# Patient Record
Sex: Female | Born: 1970
Health system: Southern US, Community
[De-identification: ages and names within clinical notes are randomized; demographics above are authoritative.]

## PROBLEM LIST (undated history)

## (undated) DIAGNOSIS — K219 Gastro-esophageal reflux disease without esophagitis: Secondary | ICD-10-CM

## (undated) DIAGNOSIS — T4145XA Adverse effect of unspecified anesthetic, initial encounter: Secondary | ICD-10-CM

## (undated) DIAGNOSIS — T8859XA Other complications of anesthesia, initial encounter: Secondary | ICD-10-CM

## (undated) HISTORY — PX: ECTOPIC PREGNANCY SURGERY: SHX613

## (undated) HISTORY — PX: TUBAL LIGATION: SHX77

## (undated) HISTORY — PX: FOOT SURGERY: SHX648

## (undated) HISTORY — PX: ABDOMINAL HYSTERECTOMY: SHX81

---
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
## 2019-04-17 ENCOUNTER — Emergency Department (HOSPITAL_BASED_OUTPATIENT_CLINIC_OR_DEPARTMENT_OTHER): Payer: Worker's Compensation

## 2019-04-17 ENCOUNTER — Encounter (HOSPITAL_BASED_OUTPATIENT_CLINIC_OR_DEPARTMENT_OTHER): Payer: Self-pay

## 2019-04-17 ENCOUNTER — Emergency Department (HOSPITAL_BASED_OUTPATIENT_CLINIC_OR_DEPARTMENT_OTHER)
Admission: EM | Admit: 2019-04-17 | Discharge: 2019-04-17 | Disposition: A | Discharge status: Home / Self Care | Payer: Worker's Compensation | Attending: Emergency Medicine | Admitting: Emergency Medicine

## 2019-04-17 ENCOUNTER — Other Ambulatory Visit: Payer: Self-pay

## 2019-04-17 DIAGNOSIS — F17290 Nicotine dependence, other tobacco product, uncomplicated: Secondary | ICD-10-CM | POA: Insufficient documentation

## 2019-04-17 DIAGNOSIS — S66397A Other injury of extensor muscle, fascia and tendon of left little finger at wrist and hand level, initial encounter: Secondary | ICD-10-CM | POA: Insufficient documentation

## 2019-04-17 DIAGNOSIS — W312XXA Contact with powered woodworking and forming machines, initial encounter: Secondary | ICD-10-CM | POA: Diagnosis not present

## 2019-04-17 DIAGNOSIS — Y9389 Activity, other specified: Secondary | ICD-10-CM | POA: Diagnosis not present

## 2019-04-17 DIAGNOSIS — S62617B Displaced fracture of proximal phalanx of left little finger, initial encounter for open fracture: Secondary | ICD-10-CM | POA: Insufficient documentation

## 2019-04-17 DIAGNOSIS — Y999 Unspecified external cause status: Secondary | ICD-10-CM | POA: Insufficient documentation

## 2019-04-17 DIAGNOSIS — S66902A Unspecified injury of unspecified muscle, fascia and tendon at wrist and hand level, left hand, initial encounter: Secondary | ICD-10-CM

## 2019-04-17 DIAGNOSIS — Y9289 Other specified places as the place of occurrence of the external cause: Secondary | ICD-10-CM | POA: Insufficient documentation

## 2019-04-17 DIAGNOSIS — S61217A Laceration without foreign body of left little finger without damage to nail, initial encounter: Secondary | ICD-10-CM | POA: Diagnosis present

## 2019-04-17 DIAGNOSIS — S62609B Fracture of unspecified phalanx of unspecified finger, initial encounter for open fracture: Secondary | ICD-10-CM

## 2019-04-17 MED ORDER — SULFAMETHOXAZOLE-TRIMETHOPRIM 800-160 MG PO TABS
1.0000 | ORAL_TABLET | Freq: Once | ORAL | Status: AC
  Administered 2019-04-17: 16:00:00 1 via ORAL
  Filled 2019-04-17: qty 1

## 2019-04-17 MED ORDER — OXYCODONE-ACETAMINOPHEN 5-325 MG PO TABS
1.0000 | ORAL_TABLET | Freq: Once | ORAL | Status: AC
  Administered 2019-04-17: 13:00:00 1 via ORAL
  Filled 2019-04-17: qty 1

## 2019-04-17 MED ORDER — LIDOCAINE HCL (PF) 1 % IJ SOLN
5.0000 mL | Freq: Once | INTRAMUSCULAR | Status: AC
  Administered 2019-04-17: 5 mL
  Filled 2019-04-17: qty 5

## 2019-04-17 MED ORDER — HYDROCODONE-ACETAMINOPHEN 5-325 MG PO TABS: 1.0000 | ORAL_TABLET | Freq: Four times a day (QID) | ORAL | 0 refills | Status: AC | PRN

## 2019-04-17 MED ORDER — SULFAMETHOXAZOLE-TRIMETHOPRIM 800-160 MG PO TABS: 1.0000 | ORAL_TABLET | Freq: Two times a day (BID) | ORAL | 0 refills | Status: AC

## 2019-04-17 MED ORDER — BUPIVACAINE HCL 0.25 % IJ SOLN
5.0000 mL | Freq: Once | INTRAMUSCULAR | Status: AC
  Administered 2019-04-17: 5 mL
  Filled 2019-04-17: qty 1

## 2019-04-17 MED ORDER — TETANUS-DIPHTH-ACELL PERTUSSIS 5-2.5-18.5 LF-MCG/0.5 IM SUSP
0.5000 mL | Freq: Once | INTRAMUSCULAR | Status: AC
  Administered 2019-04-17: 0.5 mL via INTRAMUSCULAR
  Filled 2019-04-17: qty 0.5

## 2019-04-17 MED FILL — SULFAMETHOXAZOLE-TMP DS TAB: 800-160 | 14 days supply | Qty: 27 | Fill #0

## 2019-04-17 MED FILL — HYDROCODON-APAP 5-325: 5-325 | 2 days supply | Qty: 12 | Fill #0

## 2019-04-17 NOTE — ED Notes (Signed)
Patient transported to X-ray 

## 2019-04-17 NOTE — ED Notes (Signed)
ED Provider at bedside. 

## 2019-04-17 NOTE — ED Notes (Addendum)
Pt denies taking medications pta. Pt sent to ED from [facility], states she was informed that she needed stitches

## 2019-04-17 NOTE — ED Triage Notes (Signed)
Pt with left pinky finger vs "ban saw" at work ~11am-NAD-steady gait

## 2019-04-17 NOTE — ED Notes (Signed)
Suture cart and suture tray at roomside

## 2019-04-17 NOTE — ED Provider Notes (Signed)
[facility] DEPART[facility] Provider Note   CSN: [id number] Arrival date & time: 04/17/19  1214    History   Chief Complaint Chief Complaint  Patient presents with   Finger Injury    HPI Gail Khan is a 48 y.o. female.     HPI   Patient is a 48 year old female with no significant past medical history presenting for left small finger injury.  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  Patient reports that she saw that her glove was cut but did not realize the extent of the injury until she removed her glove.  Patient reports that her finger quickly contractured in flexion after the injury.  Patient reports it is throbbing but denies any loss of sensation in the distal tip.  Unknown last tetanus shot.  History reviewed. No pertinent past medical history.  There are no active problems to display for this patient.   Past Surgical History:  Procedure Laterality Date   ABDOMINAL HYSTERECTOMY     CESAREAN SECTION     ECTOPIC PREGNANCY SURGERY     FOOT SURGERY     TUBAL LIGATION       OB History   No obstetric history on file.      Home Medications    Prior to Admission medications   Not on File    Family History No family history on file.  Social History Social History   Tobacco Use   Smoking status: Current Every Day Smoker   Smokeless tobacco: Never Used  Substance Use Topics   Alcohol use: Never    Frequency: Never   Drug use: Never     Allergies   Patient has no known allergies.   Review of Systems Review of Systems  Gastrointestinal: Negative for nausea and vomiting.  Musculoskeletal: Positive for arthralgias and joint swelling.  Skin: Positive for wound.  Neurological: Negative for weakness and numbness.  All other systems reviewed and are negative.    Physical Exam Updated Vital Signs BP 118/76 (BP  Location: Left Arm)    Pulse 93    Temp 98.7 F (37.1 C) (Oral)    Resp 16    Ht 5\' 7"  (1.702 m)    Wt 75.3 kg    SpO2 100%    BMI 26.00 kg/m   Physical Exam Vitals signs and nursing note reviewed.  Constitutional:      General: She is not in acute distress.    Appearance: She is well-developed. She is not diaphoretic.     Comments: Sitting comfortably in bed.  HENT:     Head: Normocephalic and atraumatic.  Eyes:     General:        Right eye: No discharge.        Left eye: No discharge.     Conjunctiva/sclera: Conjunctivae normal.     Comments: EOMs normal to gross examination.  Neck:     Musculoskeletal: Normal range of motion.  Cardiovascular:     Rate and Rhythm: Normal rate and regular rhythm.     Comments: Intact, 2+ left radial and ulnar pulse. Abdominal:     General: There is no distension.  Musculoskeletal:     Comments: See clinical photo for details.  Patient has open deformity overlying the PIP joint of left small finger.  Patient holds finger in static flexion.  Patient  has intact sensation to sharp touch and two-point discrimination in the distal tip of the left small finger.  She is not able to flex or extend the finger currently.  Skin:    General: Skin is warm and dry.  Neurological:     Mental Status: She is alert.     Comments: Cranial nerves intact to gross observation. Patient moves extremities without difficulty.  Psychiatric:        Behavior: Behavior normal.        Thought Content: Thought content normal.        Judgment: Judgment normal.              ED Treatments / Results  Labs (all labs ordered are listed, but only abnormal results are displayed) Labs Reviewed - No data to display  EKG None  Radiology Dg Finger Little Left  Result Date: 04/17/2019 CLINICAL DATA:  Post reduction of the small finger PIP joint. EXAM: LEFT LITTLE FINGER 2+V COMPARISON:  Left small finger x-rays from same day. FINDINGS: Interval reduction of the fifth  PIP joint with residual volar subluxation by 3 mm. Unchanged acute fracture of the dorsal aspect of the distal fifth proximal phalanx with surrounding bone fragments in the soft tissues and adjacent soft tissue laceration. There is also an avulsion fracture of the dorsal base of the fifth middle phalanx. Joint spaces are preserved. Bone mineralization is normal. IMPRESSION: 1. Interval reduction of the fifth PIP joint with residual volar subluxation. 2. Unchanged open fracture of the distal fifth proximal phalanx and small avulsion fracture at the dorsal base of the fifth middle phalanx. Electronically Signed   By: Obie DredgeWilliam T Derry M.D.   On: 04/17/2019 16:24   Dg Finger Little Left  Result Date: 04/17/2019 CLINICAL DATA:  Saw injury EXAM: LEFT LITTLE FINGER 2+V COMPARISON:  None. FINDINGS: Fracture of the distal aspect of the proximal fifth phalanx extending into the PIP joint. PIP joint is subluxed anteriorly. Possible fracture of the base of the middle phalanx. Bone fragments in the soft tissues. Open fracture with laceration. IMPRESSION: Open fracture fifth PIP joint Electronically Signed   By: Marlan Palauharles  Clark M.D.   On: 04/17/2019 13:30    Procedures .Marland Kitchen.Laceration Repair Date/Time: 04/17/2019 5:30 PM Performed by: Elisha PonderMurray, Citlalic Norlander B, PA-C Authorized by: Elisha PonderMurray, Londa Mackowski B, PA-C   Consent:    Consent obtained:  Verbal   Consent given by:  Patient   Risks discussed:  Infection, pain and need for additional repair Anesthesia (see MAR for exact dosages):    Anesthesia method:  Nerve block   Block location:  Digital block   Block needle gauge:  25 G   Block anesthetic:  Bupivacaine 0.25% w/o epi and lidocaine 1% w/o epi   Block injection procedure:  Anatomic landmarks identified, introduced needle, incremental injection, negative aspiration for blood and anatomic landmarks palpated   Block outcome:  Anesthesia achieved Laceration details:    Location:  Finger   Finger location:  L small finger    Length (cm):  2 Repair type:    Repair type:  Simple Pre-procedure details:    Preparation:  Patient was prepped and draped in usual sterile fashion and imaging obtained to evaluate for foreign bodies Exploration:    Hemostasis achieved with:  Direct pressure   Wound exploration: wound explored through full range of motion     Wound extent: tendon damage and underlying fracture     Wound extent: no nerve damage noted     Tendon  damage extent:  Complete transection   Tendon repair plan:  Refer for evaluation   Contaminated: no   Treatment:    Area cleansed with:  Betadine   Amount of cleaning:  Extensive   Irrigation solution:  Sterile saline   Irrigation volume:  1000 ml   Irrigation method:  Syringe Skin repair:    Repair method:  Sutures   Suture size:  4-0   Wound skin closure material used: Vicryl rapide.   Suture technique:  Simple interrupted   Number of sutures:  2 Approximation:    Approximation:  Loose Post-procedure details:    Dressing:  Bulky dressing and splint for protection (Splinted in extension)   Patient tolerance of procedure:  Tolerated well, no immediate complications   (including critical care time)  Medications Ordered in ED Medications - No data to display     Initial Impression / Assessment and Plan / ED Course  I have reviewed the triage vital signs and the nursing notes.  Pertinent labs & imaging results that were available during my care of the patient were reviewed by me and considered in my medical decision making (see chart for details).  Clinical Course as of Apr 16 1725  Tue Apr 17, 2019  1406 Radiograph showing fracture dislocation of the left small finger.  Will digital block finger.  Case discussed and patient imaging reviewed with Dr. Gwyneth SproutWhitney Plunkett.   [AM]  [phone] Spoke with Dr. Amanda PeaGramig who recommends post reduction film, tacking together with suture, Bactrim DS x 14 days, and he will see the patient tomorrow at 3pm. Appreciate his  involvement.    [AM]    Clinical Course User Index [AM] Elisha PonderMurray, Debraann Livingstone B, PA-C       This is a well-appearing previously healthy 48 year old female presenting for bone saw injury to the left small finger.  Obvious deformity on evaluation.  Patient has finger in static flexion and loss of extensor mechanism.  Radiograph confirmed fracture-dislocation of the distal aspect of the proximal fifth phalanx.  Distal aspect subluxed anteriorly.  This was reduced and reimaged at the request of Dr. Amanda PeaGramig.  Patient has significant laxity of the joint.  Per discussion with Dr. Amanda PeaGramig, the skin was tacked together with 2 sutures, patient placed in a bacteriostatic dressing and splinted in extension.  Patient given Bactrim DS prophylaxis.   I have reviewed the patient's information in the [place] Sub[place] Database for the past 12 months and found them to have no Rx on record. Opiates were prescribed for an acute, painful condition. The patient was given information on side effects and encouraged to use other, non-opiate pain medication primary, only using opiate medicine sparingly for severe pain.  This is a supervised visit with Dr. Gwyneth SproutWhitney Plunkett. Evaluation, management, and discharge planning discussed with this attending physician.  Final Clinical Impressions(s) / ED Diagnoses   Final diagnoses:  Open fracture dislocation of finger, initial encounter  Injury of extensor tendon of left hand, initial encounter    ED Discharge Orders         Ordered    HYDROcodone-acetaminophen (NORCO/VICODIN) 5-325 MG tablet  Every 6 hours PRN     04/17/19 1723    sulfamethoxazole-trimethoprim (BACTRIM DS) 800-160 MG tablet  2 times daily     04/17/19 1723           Delia ChimesMurray, Katriel Cutsforth B, PA-C 04/17/19 1732    Gwyneth SproutPlunkett, Whitney, MD 04/20/19 2117

## 2019-04-17 NOTE — Discharge Instructions (Signed)
Please see the information and instructions below regarding your visit.  Your diagnoses today include:  1. Open fracture dislocation of finger, initial encounter   2. Injury of extensor tendon of left hand, initial encounter     Tests performed today include: See side panel of your discharge paperwork for testing performed today. Vital signs are listed at the bottom of these instructions.   Medications prescribed:    Take any prescribed medications only as prescribed, and any over the counter medications only as directed on the packaging.  Please take all of your antibiotics until finished.   You will take bactrim twice a day for 14 days. You may develop abdominal discomfort or nausea from the antibiotic. If this occurs, you may take it with food. Some patients also get diarrhea with antibiotics. You may help offset this with probiotics which you can buy or get in yogurt. Do not eat or take the probiotics until 2 hours after your antibiotic. Some women develop vaginal yeast infections after antibiotics. If you develop unusual vaginal discharge after being on this medication, please see your primary care provider.   Some people develop allergies to antibiotics. Symptoms of antibiotic allergy can be mild and include a flat rash and itching. They can also be more serious and include:  ?Hives - Hives are raised, red patches of skin that are usually very itchy.  ?Lip or tongue swelling  ?Trouble swallowing or breathing  ?Blistering of the skin or mouth.  If you have any of these serious symptoms, please seek emergency medical care immediately.   You have been prescribed Norco for pain. This is an opioid pain medication. You may take this medication every 4-6 hours as needed for pain. Only take this medication if you need it for breakthrough pain.   Do not combine this medication with Tylenol, as it may increase the risk of liver problems.  Do not combine this medication with  alcohol.  Please be advised to avoid driving or operating heavy machinery while taking this medication, as it may make you drowsy or impair judgment.    Home care instructions:  Please follow any educational materials contained in this packet.   Follow-up instructions: Please follow-up with Dr. Amedeo Plenty on 04/18/2019 at 3pm in his [place] clinic.  Return instructions:  Please return to the Emergency Department if you experience worsening symptoms.  Please return if you have any other emergent concerns.  Additional Information:   Your vital signs today were: BP 125/81 (BP Location: Right Arm)    Pulse 73    Temp 98.7 F (37.1 C) (Oral)    Resp 18    Ht 5\' 7"  (1.702 m)    Wt 75.3 kg    SpO2 100%    BMI 26.00 kg/m  If your blood pressure (BP) was elevated on multiple readings during this visit above 130 for the top number or above 80 for the bottom number, please have this repeated by your primary care provider within one month. --------------  Thank you for allowing [place] to participate in your care today.

## 2019-04-17 NOTE — ED Notes (Signed)
Pt c/o numbness and tingling to pinky when being assessed.

## 2019-04-19 ENCOUNTER — Encounter (HOSPITAL_COMMUNITY): Payer: Self-pay | Admitting: *Deleted

## 2019-04-19 ENCOUNTER — Other Ambulatory Visit: Payer: Self-pay

## 2019-04-19 NOTE — Anesthesia Preprocedure Evaluation (Addendum)
Anesthesia Evaluation  Patient identified by MRN, date of birth, ID band Patient awake    Reviewed: Allergy & Precautions, NPO status , Patient's Chart, lab work & pertinent test results  Airway Mallampati: II  TM Distance: >3 FB Neck ROM: Full    Dental no notable dental hx. (+) Teeth Intact   Pulmonary neg pulmonary ROS, Current Smoker,    Pulmonary exam normal breath sounds clear to auscultation       Cardiovascular Exercise Tolerance: Good negative cardio ROS Normal cardiovascular exam Rhythm:Regular Rate:Normal     Neuro/Psych negative neurological ROS  negative psych ROS   GI/Hepatic Neg liver ROS, GERD  ,  Endo/Other  negative endocrine ROS  Renal/GU negative Renal ROS     Musculoskeletal negative musculoskeletal ROS (+)   Abdominal   Peds negative pediatric ROS (+)  Hematology negative hematology ROS (+)   Anesthesia Other Findings   Reproductive/Obstetrics                           Anesthesia Physical Anesthesia Plan  ASA: II  Anesthesia Plan: Regional and MAC   Post-op Pain Management:    Induction:   PONV Risk Score and Plan: Treatment may vary due to age or medical condition  Airway Management Planned: Natural Airway and Simple Face Mask  Additional Equipment:   Intra-op Plan:   Post-operative Plan:   Informed Consent: I have reviewed the patients History and Physical, chart, labs and discussed the procedure including the risks, benefits and alternatives for the proposed anesthesia with the patient or authorized representative who has indicated his/her understanding and acceptance.     Dental advisory given  Plan Discussed with: CRNA  Anesthesia Plan Comments: (L supraclavicular n block w Mac)       Anesthesia Quick Evaluation

## 2019-04-20 ENCOUNTER — Ambulatory Visit (HOSPITAL_COMMUNITY): Payer: Worker's Compensation | Admitting: Anesthesiology

## 2019-04-20 ENCOUNTER — Encounter (HOSPITAL_COMMUNITY)
Admission: RE | Disposition: A | Discharge status: Home / Self Care | Payer: Self-pay | Source: Ambulatory Visit | Attending: Orthopedic Surgery

## 2019-04-20 ENCOUNTER — Encounter (HOSPITAL_COMMUNITY): Payer: Self-pay | Admitting: *Deleted

## 2019-04-20 ENCOUNTER — Ambulatory Visit (HOSPITAL_COMMUNITY)
Admission: RE | Admit: 2019-04-20 | Discharge: 2019-04-20 | Disposition: A | Discharge status: Home / Self Care | Payer: Worker's Compensation | Source: Ambulatory Visit | Attending: Orthopedic Surgery | Admitting: Orthopedic Surgery

## 2019-04-20 ENCOUNTER — Other Ambulatory Visit (HOSPITAL_COMMUNITY)
Admission: RE | Admit: 2019-04-20 | Discharge: 2019-04-20 | Disposition: A | Discharge status: Home / Self Care | Payer: PRIVATE HEALTH INSURANCE | Source: Ambulatory Visit | Attending: Orthopedic Surgery | Admitting: Orthopedic Surgery

## 2019-04-20 ENCOUNTER — Other Ambulatory Visit (HOSPITAL_COMMUNITY): Payer: Self-pay | Admitting: *Deleted

## 2019-04-20 DIAGNOSIS — W268XXA Contact with other sharp object(s), not elsewhere classified, initial encounter: Secondary | ICD-10-CM | POA: Diagnosis not present

## 2019-04-20 DIAGNOSIS — Z1159 Encounter for screening for other viral diseases: Secondary | ICD-10-CM | POA: Diagnosis not present

## 2019-04-20 DIAGNOSIS — S61217A Laceration without foreign body of left little finger without damage to nail, initial encounter: Secondary | ICD-10-CM | POA: Diagnosis present

## 2019-04-20 DIAGNOSIS — Z01812 Encounter for preprocedural laboratory examination: Secondary | ICD-10-CM | POA: Insufficient documentation

## 2019-04-20 DIAGNOSIS — F17210 Nicotine dependence, cigarettes, uncomplicated: Secondary | ICD-10-CM | POA: Diagnosis not present

## 2019-04-20 HISTORY — DX: Gastro-esophageal reflux disease without esophagitis: K21.9

## 2019-04-20 HISTORY — DX: Other complications of anesthesia, initial encounter: T88.59XA

## 2019-04-20 HISTORY — PX: OPEN REDUCTION INTERNAL FIXATION (ORIF) PROXIMAL PHALANX: SHX6235

## 2019-04-20 LAB — CBC
HCT: 39.9 % (ref 36.0–46.0)
Hemoglobin: 12.6 g/dL (ref 12.0–15.0)
MCH: 29.1 pg (ref 26.0–34.0)
MCHC: 31.6 g/dL (ref 30.0–36.0)
MCV: 92.1 fL (ref 80.0–100.0)
Platelets: 189 10*3/uL (ref 150–400)
RBC: 4.33 MIL/uL (ref 3.87–5.11)
RDW: 14 % (ref 11.5–15.5)
WBC: 8.1 10*3/uL (ref 4.0–10.5)
nRBC: 0 % (ref 0.0–0.2)

## 2019-04-20 LAB — SARS CORONAVIRUS 2 BY RT PCR (HOSPITAL ORDER, PERFORMED IN ~~LOC~~ HOSPITAL LAB): SARS Coronavirus 2: NEGATIVE

## 2019-04-20 SURGERY — OPEN REDUCTION INTERNAL FIXATION (ORIF) PROXIMAL PHALANX
Anesthesia: Monitor Anesthesia Care | Laterality: Left

## 2019-04-20 MED ORDER — PROPOFOL 500 MG/50ML IV EMUL
INTRAVENOUS | Status: DC | PRN
  Administered 2019-04-20: 100 ug/kg/min via INTRAVENOUS

## 2019-04-20 MED ORDER — HYDROMORPHONE HCL 1 MG/ML IJ SOLN: 0.2500 mg | INTRAMUSCULAR | Status: DC | PRN

## 2019-04-20 MED ORDER — FENTANYL CITRATE (PF) 100 MCG/2ML IJ SOLN
INTRAMUSCULAR | Status: AC
  Administered 2019-04-20: 100 ug
  Filled 2019-04-20: qty 2

## 2019-04-20 MED ORDER — MIDAZOLAM HCL 2 MG/2ML IJ SOLN
INTRAMUSCULAR | Status: AC
  Administered 2019-04-20: 2 mg
  Filled 2019-04-20: qty 2

## 2019-04-20 MED ORDER — ONDANSETRON HCL 4 MG/2ML IJ SOLN
INTRAMUSCULAR | Status: AC
  Filled 2019-04-20: qty 2

## 2019-04-20 MED ORDER — HYDROCODONE-ACETAMINOPHEN 5-325 MG PO TABS: 1.0000 | ORAL_TABLET | ORAL | 0 refills | Status: AC | PRN

## 2019-04-20 MED ORDER — MEPERIDINE HCL 25 MG/ML IJ SOLN: 6.2500 mg | INTRAMUSCULAR | Status: DC | PRN

## 2019-04-20 MED ORDER — FENTANYL CITRATE (PF) 250 MCG/5ML IJ SOLN
INTRAMUSCULAR | Status: AC
  Filled 2019-04-20: qty 5

## 2019-04-20 MED ORDER — ACETAMINOPHEN 10 MG/ML IV SOLN: 1000.0000 mg | Freq: Once | INTRAVENOUS | Status: DC | PRN

## 2019-04-20 MED ORDER — DEXAMETHASONE SODIUM PHOSPHATE 10 MG/ML IJ SOLN
INTRAMUSCULAR | Status: AC
  Filled 2019-04-20: qty 1

## 2019-04-20 MED ORDER — CHLORHEXIDINE GLUCONATE 4 % EX LIQD: 60.0000 mL | Freq: Once | CUTANEOUS | Status: DC

## 2019-04-20 MED ORDER — PROPOFOL 10 MG/ML IV BOLUS
INTRAVENOUS | Status: DC | PRN
  Administered 2019-04-20 (×3): 25 mg via INTRAVENOUS

## 2019-04-20 MED ORDER — CEFAZOLIN SODIUM-DEXTROSE 2-4 GM/100ML-% IV SOLN
2.0000 g | INTRAVENOUS | Status: AC
  Administered 2019-04-20: 16:00:00 2 g via INTRAVENOUS
  Filled 2019-04-20: qty 100

## 2019-04-20 MED ORDER — LACTATED RINGERS IV SOLN
INTRAVENOUS | Status: DC
  Administered 2019-04-20: 12:00:00 via INTRAVENOUS

## 2019-04-20 MED ORDER — MIDAZOLAM HCL 2 MG/2ML IJ SOLN
INTRAMUSCULAR | Status: AC
  Filled 2019-04-20: qty 2

## 2019-04-20 MED ORDER — MIDAZOLAM HCL 2 MG/2ML IJ SOLN: 2.0000 mg | Freq: Once | INTRAMUSCULAR | Status: DC

## 2019-04-20 MED ORDER — FENTANYL CITRATE (PF) 100 MCG/2ML IJ SOLN
INTRAMUSCULAR | Status: DC | PRN
  Administered 2019-04-20: 50 ug via INTRAVENOUS

## 2019-04-20 MED ORDER — HYDROCODONE-ACETAMINOPHEN 7.5-325 MG PO TABS: 1.0000 | ORAL_TABLET | Freq: Once | ORAL | Status: DC | PRN

## 2019-04-20 MED ORDER — FENTANYL CITRATE (PF) 100 MCG/2ML IJ SOLN: 100.0000 ug | Freq: Once | INTRAMUSCULAR | Status: DC

## 2019-04-20 MED ORDER — ROPIVACAINE HCL 5 MG/ML IJ SOLN
INTRAMUSCULAR | Status: DC | PRN
  Administered 2019-04-20: 20 mL via PERINEURAL

## 2019-04-20 MED ORDER — CLONIDINE HCL (ANALGESIA) 100 MCG/ML EP SOLN
EPIDURAL | Status: DC | PRN
  Administered 2019-04-20: 100 ug

## 2019-04-20 MED ORDER — PROMETHAZINE HCL 25 MG/ML IJ SOLN: 6.2500 mg | INTRAMUSCULAR | Status: DC | PRN

## 2019-04-20 MED ORDER — 0.9 % SODIUM CHLORIDE (POUR BTL) OPTIME
TOPICAL | Status: DC | PRN
  Administered 2019-04-20: 1000 mL

## 2019-04-20 SURGICAL SUPPLY — 48 items
BANDAGE ACE 3X5.8 VEL STRL LF (GAUZE/BANDAGES/DRESSINGS) ×3 IMPLANT
BANDAGE ACE 4X5 VEL STRL LF (GAUZE/BANDAGES/DRESSINGS) ×3 IMPLANT
BIT DRILL 1.1 (BIT) ×1
BIT DRILL 1.1 MINI QC NONSTRL (BIT) ×3 IMPLANT
BIT DRILL 1.1MM (BIT) ×1
BIT DRILL 60X20X1.1XQC TMX (BIT) ×1 IMPLANT
BIT DRL 60X20X1.1XQC TMX (BIT) ×1
BNDG ESMARK 4X9 LF (GAUZE/BANDAGES/DRESSINGS) ×3 IMPLANT
CORD BIPOLAR FORCEPS 12FT (ELECTRODE) ×3 IMPLANT
COVER SURGICAL LIGHT HANDLE (MISCELLANEOUS) ×3 IMPLANT
CUFF TOURNIQUET SINGLE 18IN (TOURNIQUET CUFF) ×3 IMPLANT
DRSG ADAPTIC 3X8 NADH LF (GAUZE/BANDAGES/DRESSINGS) ×3 IMPLANT
ELECT REM PT RETURN 9FT ADLT (ELECTROSURGICAL) ×3
ELECTRODE REM PT RTRN 9FT ADLT (ELECTROSURGICAL) ×1 IMPLANT
GAUZE SPONGE 4X4 12PLY STRL LF (GAUZE/BANDAGES/DRESSINGS) ×3 IMPLANT
GAUZE XEROFORM 5X9 LF (GAUZE/BANDAGES/DRESSINGS) ×3 IMPLANT
GLOVE BIO SURGEON STRL SZ7 (GLOVE) ×3 IMPLANT
GLOVE SS BIOGEL STRL SZ 8 (GLOVE) ×1 IMPLANT
GLOVE SUPERSENSE BIOGEL SZ 8 (GLOVE) ×2
GLOVE SURG SS PI 6.5 STRL IVOR (GLOVE) ×3 IMPLANT
GLOVE SURG SS PI 8.0 STRL IVOR (GLOVE) ×3 IMPLANT
GOWN STRL REUS W/ TWL LRG LVL3 (GOWN DISPOSABLE) ×1 IMPLANT
GOWN STRL REUS W/TWL LRG LVL3 (GOWN DISPOSABLE) ×2
KIT BASIN OR (CUSTOM PROCEDURE TRAY) ×3 IMPLANT
KIT TURNOVER KIT B (KITS) ×3 IMPLANT
LOCK SCREW 1.5X9MM (Screw) ×3 IMPLANT
NS IRRIG 1000ML POUR BTL (IV SOLUTION) ×3 IMPLANT
PACK ORTHO EXTREMITY (CUSTOM PROCEDURE TRAY) ×3 IMPLANT
PAD ARMBOARD 7.5X6 YLW CONV (MISCELLANEOUS) ×3 IMPLANT
PAD CAST 3X4 CTTN HI CHSV (CAST SUPPLIES) ×1 IMPLANT
PAD CAST 4YDX4 CTTN HI CHSV (CAST SUPPLIES) ×1 IMPLANT
PADDING CAST COTTON 3X4 STRL (CAST SUPPLIES) ×2
PADDING CAST COTTON 4X4 STRL (CAST SUPPLIES) ×2
PILLOW ARM CARTER PEDIATRIC (MISCELLANEOUS) ×3 IMPLANT
PLATE STRAIGHT LOCK 1.5 (Plate) ×3 IMPLANT
PUTTY DBM STAGRAFT 2CC (Putty) ×3 IMPLANT
SCREW 1.5X8 (Screw) ×6 IMPLANT
SCREW LOCK 1.5X9MM (Screw) ×1 IMPLANT
SCREW LOCKING 1.5X10 (Screw) ×3 IMPLANT
SCREW LOCKING 1.5X11MM (Screw) ×3 IMPLANT
SCREW NONIOC 1.5 10M (Screw) ×3 IMPLANT
SOL PREP POV-IOD 4OZ 10% (MISCELLANEOUS) ×3 IMPLANT
SUT PROLENE 4 0 PS 2 18 (SUTURE) ×3 IMPLANT
SUT PROLENE 5 0 P 3 (SUTURE) ×3 IMPLANT
SUT VICRYL 4-0 PS2 18IN ABS (SUTURE) ×3 IMPLANT
TUBE CONNECTING 12'X1/4 (SUCTIONS) ×1
TUBE CONNECTING 12X1/4 (SUCTIONS) ×2 IMPLANT
UNDERPAD 30X30 (UNDERPADS AND DIAPERS) ×3 IMPLANT

## 2019-04-20 NOTE — Discharge Instructions (Signed)
You did very well with your surgery.  Dr. Vanetta Shawl office will call to see you in 12 to 14 days.  Keep your bandage clean and dry and do not remove it. Please make sure that you keep your bandage dry when showering with shower cover or bag      We recommend that you to take vitamin C 1000 mg a day to promote healing. We also recommend that if you require  pain medicine that you take a stool softener to prevent constipation as most pain medicines will have constipation side effects. We recommend either Peri-Colace or Senokot and recommend that you also consider adding MiraLAX as well to prevent the constipation affects from pain medicine if you are required to use them. These medicines are over the counter and may be purchased at a local pharmacy. A cup of yogurt and a probiotic can also be helpful during the recovery process as the medicines can disrupt your intestinal environment. Keep bandage clean and dry.  Call for any problems.  No smoking.  Criteria for driving a car: you should be off your pain medicine for 7-8 hours, able to drive one handed(confident), thinking clearly and feeling able in your judgement to drive. Continue elevation as it will decrease swelling.  Call immediately for any sudden loss of feeling in your hand/arm or change in functional abilities of the extremity.    post Anesthesia Home Care Instructions  Activity: Get plenty of rest for the remainder of the day. A responsible individual must stay with you for 24 hours following the procedure.  For the next 24 hours, DO NOT: -Drive a car -Paediatric nurse -Drink alcoholic beverages -Take any medication unless instructed by your physician -Make any legal decisions or sign important papers.  Meals: Start with liquid foods such as gelatin or soup. Progress to regular foods as tolerated. Avoid greasy, spicy, heavy foods. If nausea and/or vomiting occur, drink only clear liquids until the nausea and/or vomiting  subsides. Call your physician if vomiting continues.  Special Instructions/Symptoms: Your throat may feel dry or sore from the anesthesia or the breathing tube placed in your throat during surgery. If this causes discomfort, gargle with warm salt water. The discomfort should disappear within 24 hours.  If you had a scopolamine patch placed behind your ear for the management of post- operative nausea and/or vomiting:  1. The medication in the patch is effective for 72 hours, after which it should be removed.  Wrap patch in a tissue and discard in the trash. Wash hands thoroughly with soap and water. 2. You may remove the patch earlier than 72 hours if you experience unpleasant side effects which may include dry mouth, dizziness or visual disturbances. 3. Avoid touching the patch. Wash your hands with soap and water after contact with the patch.
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
## 2019-04-20 NOTE — Anesthesia Procedure Notes (Signed)
Procedure Name: MAC Date/Time: 04/20/2019 3:46 PM Performed by: Imagene Riches, CRNA Pre-anesthesia Checklist: Patient identified, Emergency Drugs available, Suction available and Patient being monitored Patient Re-evaluated:Patient Re-evaluated prior to induction Oxygen Delivery Method: Simple face mask Preoxygenation: Pre-oxygenation with 100% oxygen

## 2019-04-20 NOTE — Progress Notes (Signed)
Orthopedic Tech Progress Note Patient Details:  Gail Khan June 25, 1971 [id number] PACU RN called requesting arm sling and she was assisting patient with getting dressed and said she would apply it to patient Ortho Devices Type of Ortho Device: Arm sling Ortho Device/Splint Location: ULE Ortho Device/Splint Interventions: Other (comment)   Post Interventions Patient Tolerated: Other (comment) Instructions Provided: Other (comment)   [name] 04/20/2019, 6:12 PM

## 2019-04-20 NOTE — H&P (Signed)
Gail SnideShonda Lorenz Khan is an 48 y.o. female.   Chief Complaint: left small finger on the job injury with loss of bone tendon and skin HPI: Patient presents for evaluation and treatment of the of their upper extremity predicament. The patient denies neck, back, chest or  abdominal pain. The patient notes that they have no lower extremity problems. The patients primary complaint is noted. We are planning surgical care pathway for the upper extremity.  Past Medical History:  Diagnosis Date  . Complication of anesthesia    history of awaken during surgery  . GERD (gastroesophageal reflux disease)     Past Surgical History:  Procedure Laterality Date  . ABDOMINAL HYSTERECTOMY    . CESAREAN SECTION    . ECTOPIC PREGNANCY SURGERY    . FOOT SURGERY Right    laceration  . TUBAL LIGATION      History reviewed. No pertinent family history. Social History:  reports that she has been smoking cigarettes. She has a 10.00 pack-year smoking history. She has never used smokeless tobacco. She reports current alcohol use. She reports that she does not use drugs.  Allergies: No Known Allergies  Medications Prior to Admission  Medication Sig Dispense Refill  . calcium carbonate (TUMS EX) 750 MG chewable tablet Chew 1 tablet by mouth every 8 (eight) hours as needed for heartburn.    Marland Kitchen. HYDROcodone-acetaminophen (NORCO/VICODIN) 5-325 MG tablet Take 1-2 tablets by mouth every 6 (six) hours as needed for up to 3 days. 12 tablet 0  . methocarbamol (ROBAXIN) 500 MG tablet Take 500 mg by mouth every 6 (six) hours as needed for muscle spasms.    Marland Kitchen. sulfamethoxazole-trimethoprim (BACTRIM DS) 800-160 MG tablet Take 1 tablet by mouth 2 (two) times daily for 14 days. 27 tablet 0    No results found for this or any previous visit (from the past 48 hour(s)). No results found.  Review of Systems  Respiratory: Negative.   Cardiovascular: Negative.   Gastrointestinal: Negative.   Genitourinary: Negative.     Blood  pressure (!) 113/57, pulse 81, temperature 98.4 F (36.9 C), temperature source Oral, resp. rate 20, height 5\' 7"  (1.702 m), weight 75.3 kg, SpO2 100 %. Physical Exam significant bone tendon and skin loss left small finger after an on-the-job injury was soft.  Pnt has intact distal refill. The patient is alert and oriented in no acute distress. The patient complains of pain in the affected upper extremity.  The patient is noted to have a normal HEENT exam. Lung fields show equal chest expansion and no shortness of breath. Abdomen exam is nontender without distention. Lower extremity examination does not show any fracture dislocation or blood clot symptoms. Pelvis is stable and the neck and back are stable and nontender. Assessment/Plan We are planning surgery for your upper extremity. The risk and benefits of surgery to include risk of bleeding, infection, anesthesia,  damage to normal structures and failure of the surgery to accomplish its intended goals of relieving symptoms and restoring function have been discussed in detail. With this in mind we plan to proceed. I have specifically discussed with the patient the pre-and postoperative regime and the dos and don'ts and risk and benefits in great detail. Risk and benefits of surgery also include risk of dystrophy(CRPS), chronic nerve pain, failure of the healing process to go onto completion and other inherent risks of surgery The relavent the pathophysiology of the disease/injury process, as well as the alternatives for treatment and postoperative course of action has been  discussed in great detail with the patient who desires to proceed.  We will do everything in our power to help you (the patient) restore function to the upper extremity. It is a pleasure to see this patient today.  We will plan for repair reconstruction with primary PIP fusion plate and screw fixation and dil radius bonegraft is necessary left small finger with extensor repair  reconstruction Willa Frater III, MD 04/20/2019, 12:28 PM

## 2019-04-20 NOTE — Anesthesia Procedure Notes (Signed)
Anesthesia Regional Block: Supraclavicular block   Pre-Anesthetic Checklist: ,, timeout performed, Correct Patient, Correct Site, Correct Laterality, Correct Procedure, Correct Position, site marked, Risks and benefits discussed,  Surgical consent,  Pre-op evaluation,  At surgeon's request and post-op pain management  Laterality: Left  Prep: chloraprep       Needles:  Injection technique: Single-shot  Needle Type: Echogenic Needle     Needle Length: 5cm  Needle Gauge: 21     Additional Needles:   Procedures:,,,, ultrasound used (permanent image in chart),,,,  Narrative:  Start time: 04/20/2019 1:47 PM End time: 04/20/2019 1:52 PM Injection made incrementally with aspirations every 5 mL.  Performed by: Personally  Anesthesiologist: Barnet Glasgow, MD

## 2019-04-20 NOTE — Transfer of Care (Signed)
Immediate Anesthesia Transfer of Care Note  Patient: Gail Khan  Procedure(s) Performed: Open treatment fracture dislocation with primary proximal interphalangeal fusion with distal radius bone graft and repair reconstruction (Left )  Patient Location: PACU  Anesthesia Type:Regional  Level of Consciousness: awake  Airway & Oxygen Therapy: Patient Spontanous Breathing  Post-op Assessment: Report given to RN and Post -op Vital signs reviewed and stable  Post vital signs: Reviewed and stable  Last Vitals:  Vitals Value Taken Time  BP 134/75 04/20/19 1717  Temp    Pulse 81 04/20/19 1718  Resp 22 04/20/19 1718  SpO2 100 % 04/20/19 1718  Vitals shown include unvalidated device data.  Last Pain:  Vitals:   04/20/19 1400  TempSrc:   PainSc: 3       Patients Stated Pain Goal: 7 (11/09/70 5366)  Complications: No apparent anesthesia complications

## 2019-04-20 NOTE — Anesthesia Postprocedure Evaluation (Signed)
Anesthesia Post Note  Patient: Gail Khan, Gail Khan  Procedure(s) Performed: Open treatment fracture dislocation with primary proximal interphalangeal fusion with distal radius bone graft and repair reconstruction (Left )     Patient location during evaluation: PACU Anesthesia Type: Regional Level of consciousness: awake and alert Pain management: pain level controlled Vital Signs Assessment: post-procedure vital signs reviewed and stable Respiratory status: spontaneous breathing, nonlabored ventilation, respiratory function stable and patient connected to nasal cannula oxygen Cardiovascular status: stable and blood pressure returned to baseline Postop Assessment: no apparent nausea or vomiting Anesthetic complications: no    Last Vitals:  Vitals:   04/20/19 1732 04/20/19 1747  BP: 132/61 133/82  Pulse: 66 66  Resp: 17   Temp:    SpO2: 97% 98%    Last Pain:  Vitals:   04/20/19 1732  TempSrc:   PainSc: 0-No pain                 [name]

## 2019-04-23 ENCOUNTER — Encounter (HOSPITAL_COMMUNITY): Payer: Self-pay | Admitting: Orthopedic Surgery

## 2020-12-28 IMAGING — CR LEFT LITTLE FINGER 2+V
3 series · 3 of 3 positions shown · non-contrast
Comparison: None.

CLINICAL DATA: Saw injury

EXAM:
LEFT LITTLE FINGER 2+V

[x finger pa left]
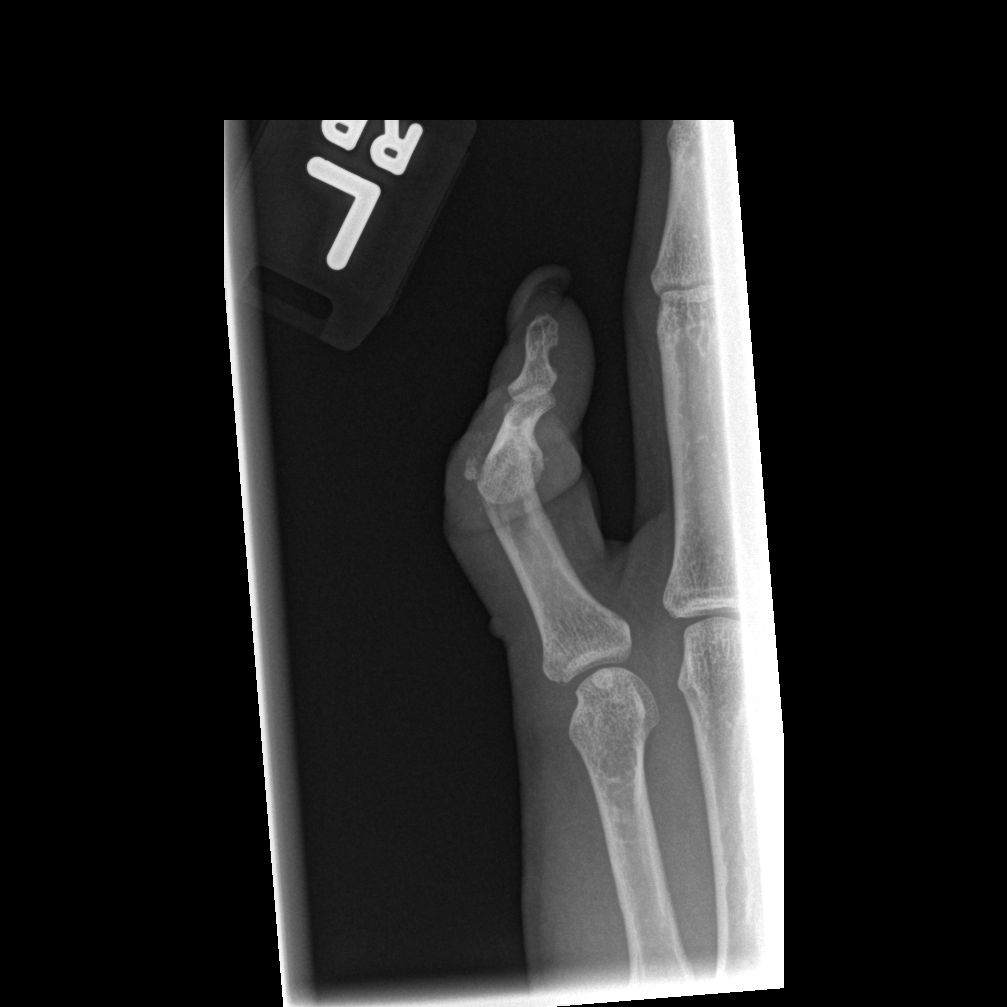

[x finger obl. left]
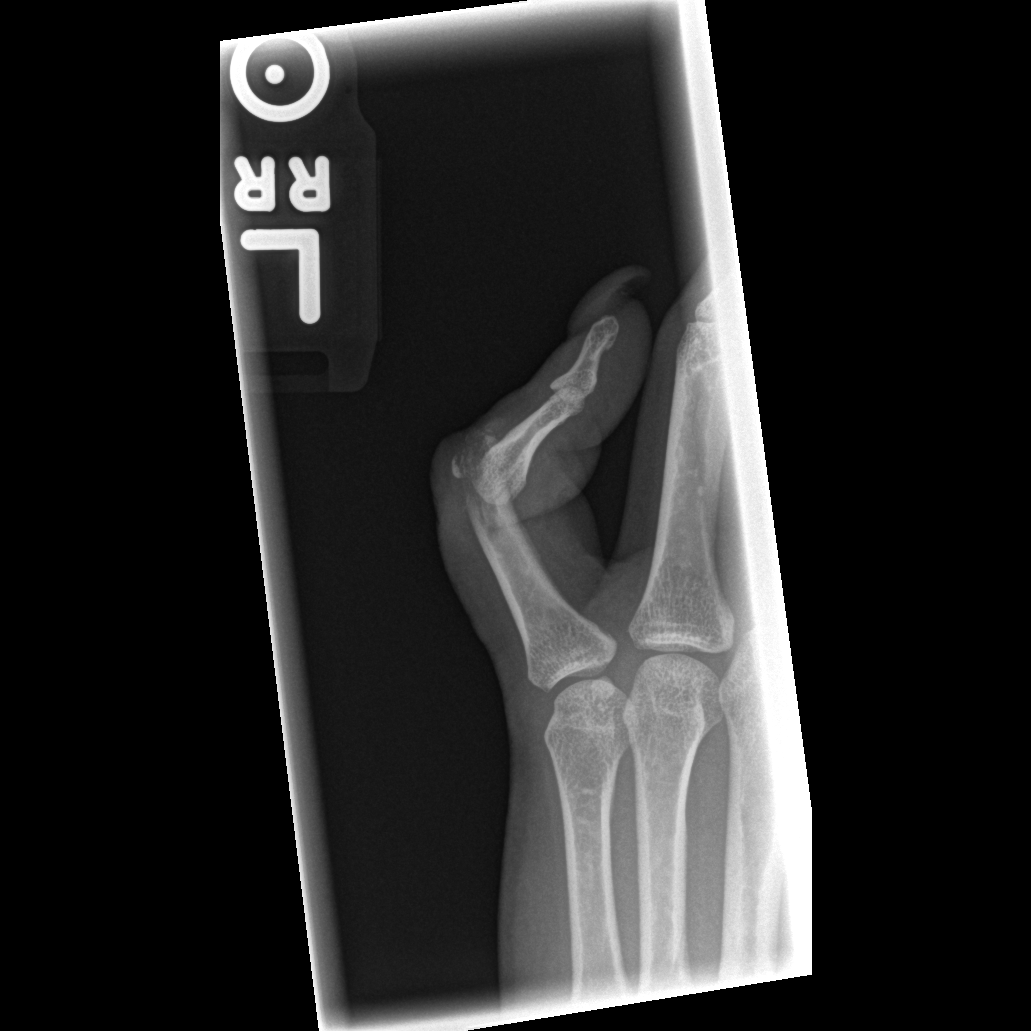

[x finger lateral left *]
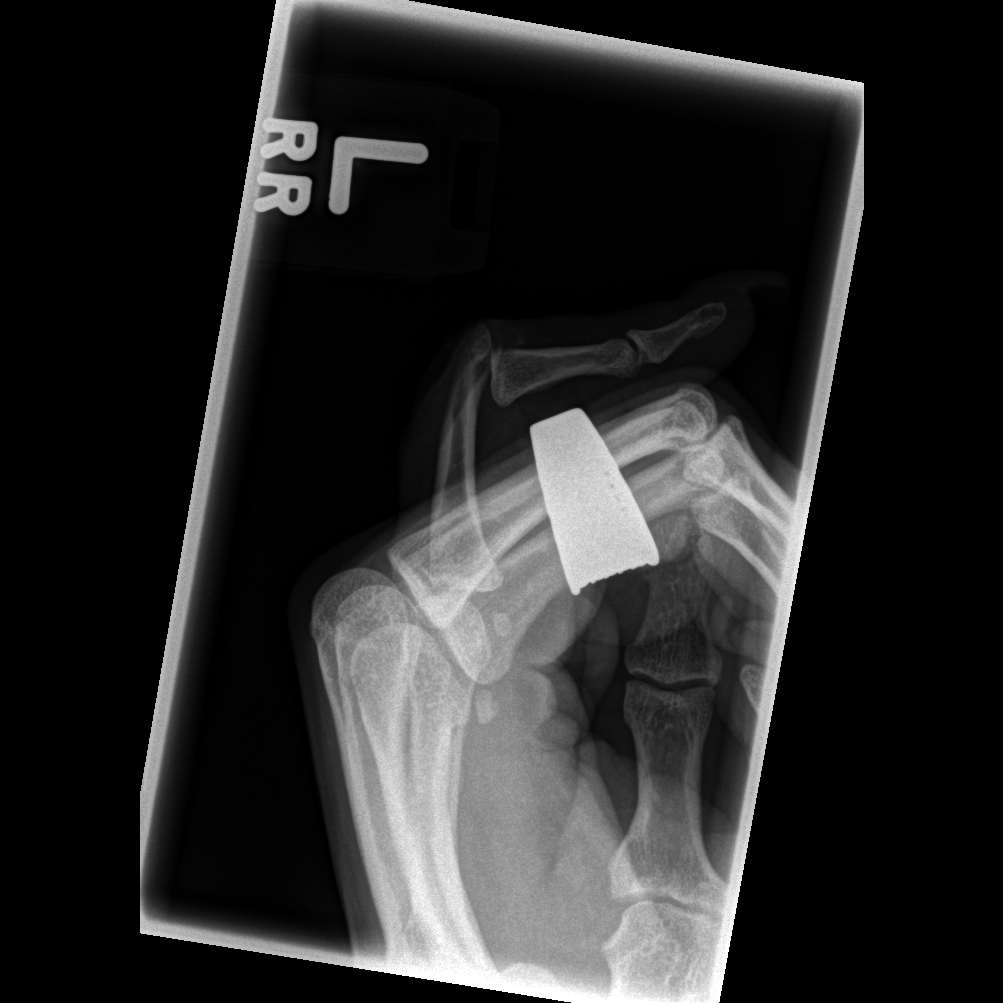

[3 of 3 positions shown; findings below may reference images not displayed]

FINDINGS: Fracture of the distal aspect of the proximal fifth phalanx
extending into the PIP joint. PIP joint is subluxed anteriorly.
Possible fracture of the base of the middle phalanx. Bone fragments
in the soft tissues. Open fracture with laceration.
IMPRESSION: Open fracture fifth PIP joint
